# Patient Record
Sex: Female | Born: 1975 | Race: White | Hispanic: No | Marital: Single | Smoking: Never smoker
Health system: Southern US, Community
[De-identification: ages and names within clinical notes are randomized; demographics above are authoritative.]

---

## 2008-10-12 ENCOUNTER — Ambulatory Visit: Payer: Self-pay | Admitting: Obstetrics and Gynecology

## 2008-10-13 ENCOUNTER — Inpatient Hospital Stay: Payer: Self-pay | Admitting: Obstetrics and Gynecology

## 2010-04-13 ENCOUNTER — Ambulatory Visit (HOSPITAL_COMMUNITY): Admission: RE | Admit: 2010-04-13 | Discharge: 2010-04-13 | Payer: Self-pay | Admitting: Obstetrics and Gynecology

## 2014-08-11 ENCOUNTER — Ambulatory Visit: Payer: Self-pay | Admitting: Obstetrics and Gynecology

## 2016-07-25 ENCOUNTER — Other Ambulatory Visit: Payer: Self-pay | Admitting: Obstetrics and Gynecology

## 2016-07-25 DIAGNOSIS — Z1231 Encounter for screening mammogram for malignant neoplasm of breast: Secondary | ICD-10-CM

## 2016-10-14 ENCOUNTER — Other Ambulatory Visit: Payer: Self-pay | Admitting: Obstetrics and Gynecology

## 2016-10-14 DIAGNOSIS — Z1231 Encounter for screening mammogram for malignant neoplasm of breast: Secondary | ICD-10-CM

## 2017-01-14 ENCOUNTER — Ambulatory Visit
Admission: RE | Admit: 2017-01-14 | Discharge: 2017-01-14 | Disposition: A | Discharge status: Home / Self Care | Payer: BC Managed Care – PPO | Source: Ambulatory Visit | Attending: Obstetrics and Gynecology | Admitting: Obstetrics and Gynecology

## 2017-01-14 ENCOUNTER — Encounter: Payer: Self-pay | Admitting: Radiology

## 2017-01-14 DIAGNOSIS — Z1231 Encounter for screening mammogram for malignant neoplasm of breast: Secondary | ICD-10-CM | POA: Diagnosis present

## 2018-02-26 ENCOUNTER — Ambulatory Visit: Payer: Self-pay | Admitting: Physician Assistant

## 2018-02-26 ENCOUNTER — Other Ambulatory Visit: Payer: Self-pay

## 2018-02-26 ENCOUNTER — Encounter: Payer: Self-pay | Admitting: Physician Assistant

## 2018-02-26 ENCOUNTER — Ambulatory Visit (INDEPENDENT_AMBULATORY_CARE_PROVIDER_SITE_OTHER): Payer: BC Managed Care – PPO | Admitting: Physician Assistant

## 2018-02-26 VITALS — BP 110/72 | HR 78 | Temp 97.6°F | Resp 16 | Ht 58.25 in | Wt 117.0 lb

## 2018-02-26 DIAGNOSIS — Z0000 Encounter for general adult medical examination without abnormal findings: Secondary | ICD-10-CM | POA: Diagnosis not present

## 2018-02-26 DIAGNOSIS — R519 Headache, unspecified: Secondary | ICD-10-CM

## 2018-02-26 DIAGNOSIS — R51 Headache: Secondary | ICD-10-CM | POA: Diagnosis not present

## 2018-02-26 DIAGNOSIS — Z23 Encounter for immunization: Secondary | ICD-10-CM

## 2018-02-26 MED ORDER — PREDNISONE 20 MG PO TABS: ORAL_TABLET | ORAL | 0 refills | Status: DC

## 2018-02-26 NOTE — Progress Notes (Signed)
Patient ID: Melissa Crosby MRN: [id number], DOB: [birth year], 42 y.o. Date of Encounter: 02/26/2018,   Chief Complaint: New Patient, Establish Care, Physical (CPE)  HPI: 42 y.o. y/o female here for above.    She reports that she has lived in this area for 15 years.   States that she has always gone to her OB/GYN every year.   However has never had a PCP.   Decided that she probably should get established with a PCP "and get baseline ".  As far as seeing her gynecologist, she reports that she has had no abnormal GYN history. She has no known medical history at all.   The only surgery she has had is a C-section. Family History -- details are documented in that tab.  There is no premature family history of CAD or CA.  She works as a Chartered loss adjusterschoolteacher.  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  She works with the [employer]. [place] in [place].  She has a 44-year-old daughter and a 44 year old son.   Discussed that I have a daughter who is age 949 and a son who is age 44.  She states that the only issue that she needs to discuss today is that she has been having a nagging headache since around May.   On average she will feel it  5 to 7 days/week.   Says that when it started it was near the end of the school year since she thought it may just be related to stress etc.   However it would occur sometimes on weekends and has continued to occur this summer even though she has very minimal stress.   Points to her left forehead just above her left eye as the area where she feels the discomfort.   States that usually if it occurs it will come on in the morning.  It is not present when she first wakes up.  However she states that usually if she has not had a headache by around 11 AM that she usually does not develop a  headache that day. However does not happen every day.   States that she would rate the discomfort as 3/10.  Says that she really has not been using any medication for this recently.  States that there was about 3 weeks where it was more significant and she was taking 2 ibuprofen per day. States that it causes no photophobia.  No nausea. There is no family history of migraine. Asked if she is seen any pattern related to menses or eating chocolate or drinking wine.  She states that it does not seem to correlate with her menstrual cycle.  States that she does not drink wine and does not eat much chocolate.  Has seen no pattern or triggers.  That she does notice that sometimes it seems to increase if she is outside.  Is wondering if it is related to sinuses/allergies.  States that the area above her left eye at her left forehead is a little bit tender to the touch.  Reports that she has been having no mucus from the nose.  No rhinorrhea.  No other specific concerns to address today.     Review of Systems: Consitutional: No fever, chills, fatigue, night sweats, lymphadenopathy. No significant/unexplained weight changes. Eyes: No visual  changes, eye redness, or discharge. ENT/Mouth: No ear pain, sore throat, nasal drainage, or sinus pain. Cardiovascular: No chest pressure,heaviness, tightness or squeezing, even with exertion. No increased shortness of breath or dyspnea on exertion.No palpitations, edema, orthopnea, PND. Respiratory: No cough, hemoptysis, SOB, or wheezing. Gastrointestinal: No anorexia, dysphagia, reflux, pain, nausea, vomiting, hematemesis, diarrhea, constipation, BRBPR, or melena. Breast: No mass, nodules, bulging, or retraction. No skin changes or inflammation. No nipple discharge. No lymphadenopathy. Genitourinary: No dysuria, hematuria, incontinence, vaginal discharge, pruritis, burning, abnormal bleeding, or pain. Musculoskeletal: No decreased ROM, No joint pain or swelling. No  significant pain in neck, back, or extremities. Skin: No rash, pruritis, or concerning lesions. Neurological: No dizziness, syncope, seizures, tremors, memory loss, coordination problems, or paresthesias. Psychological: No anxiety, depression, hallucinations, SI/HI. Endocrine: No polydipsia, polyphagia, polyuria, or known diabetes.No increased fatigue. No palpitations/rapid heart rate. No significant/unexplained weight change. All other systems were reviewed and are otherwise negative.  History reviewed. No pertinent past medical history.   Past Surgical History:  Procedure Laterality Date  . CESAREAN SECTION      Home Meds:  Outpatient Medications Prior to Visit  Medication Sig Dispense Refill  . Multiple Vitamin (MULTIVITAMIN) tablet Take 1 tablet by mouth daily.     No facility-administered medications prior to visit.     Allergies: No Known Allergies  Social History   Socioeconomic History  . Marital status: Single    Spouse name: Not on file  . Number of children: Not on file  . Years of education: Not on file  . Highest education level: Not on file  Occupational History  . Not on file  Social Needs  . Financial resource strain: Not on file  . Food insecurity:    Worry: Not on file    Inability: Not on file  . Transportation needs:    Medical: Not on file    Non-medical: Not on file  Tobacco Use  . Smoking status: Never Smoker  . Smokeless tobacco: Never Used  Substance and Sexual Activity  . Alcohol use: Never    Frequency: Never  . Drug use: Not Currently  . Sexual activity: Not on file  Lifestyle  . Physical activity:    Days per week: Not on file    Minutes per session: Not on file  . Stress: Not on file  Relationships  . Social connections:    Talks on phone: Not on file    Gets together: Not on file    Attends religious service: Not on file    Active member of club or organization: Not on file    Attends meetings of clubs or organizations: Not  on file    Relationship status: Not on file  . Intimate partner violence:    Fear of current or ex partner: Not on file    Emotionally abused: Not on file    Physically abused: Not on file    Forced sexual activity: Not on file  Other Topics Concern  . Not on file  Social History Narrative  . Not on file    Family History  Problem Relation Age of Onset  . Hypertension Mother   . Hyperlipidemia Mother   . Alcohol abuse Father   . Early death Father   . Hypertension Sister   . Cancer Maternal Aunt   . Arthritis Maternal Grandmother   . Diabetes Maternal Grandmother   . Hyperlipidemia Maternal Grandmother   . Kidney disease Maternal Grandmother     Physical Exam:  Blood pressure 110/72, pulse 78, temperature 97.6 F (36.4 C), temperature source Oral, resp. rate 16, height 4' 10.25" (1.48 m), weight 53.1 kg (117 lb), SpO2 100 %., Body mass index is 24.24 kg/m. General: Well developed, well nourished WF. Appears in no acute distress. HEENT: Normocephalic, atraumatic. Conjunctiva pink, sclera non-icteric. Pupils 2 mm constricting to 1 mm, round, regular, and equally reactive to light and accomodation. EOMI. Internal auditory canal clear. TMs with good cone of light and without pathology. Nasal mucosa pink. Nares are without discharge. No sinus tenderness. Oral mucosa pink. She does have tenderness with percussion to left frontal sinus region.  No tenderness with percussion to right frontal sinus or bilateral maxillary sinus regions.  No tenderness with percussion of temporal artery area at temples bilaterally.  Neck: Supple. Trachea midline. No thyromegaly. Full ROM. No lymphadenopathy.No Carotid Bruits. Lungs: Clear to auscultation bilaterally without wheezes, rales, or rhonchi. Breathing is of normal effort and unlabored. Cardiovascular: RRR with S1 S2. No murmurs, rubs, or gallops. Distal pulses 2+ symmetrically. No carotid or abdominal bruits. Breast: Per Gyn Abdomen: Soft,  non-tender, non-distended with normoactive bowel sounds. No hepatosplenomegaly or masses. No rebound/guarding. No CVA tenderness. No hernias.  Genitourinary:  Per Gyn Musculoskeletal: Full range of motion and 5/5 strength throughout. Skin: Warm and moist without erythema, ecchymosis, wounds, or rash. Neuro: A+Ox3. CN II-XII grossly intact. Moves all extremities spontaneously. Full sensation throughout. Normal gait. Psych:  Responds to questions appropriately with a normal affect.   Assessment/Plan:  42 y.o. y/o female here for CPE   1. Encounter for medical examination to establish care  2. Encounter for preventive health examination  A. Screening Labs: She is not fasting today but reports that she will return fasting for labs tomorrow morning. - CBC with Differential/Platelet; Future - COMPLETE METABOLIC PANEL WITH GFR; Future - Lipid panel; Future - TSH; Future  B. Pap: --Per Gyn  C. Screening Mammogram: --Per Gyn Her mammograms are in epic.  Last was 01/14/2017.  Negative.  D. DEXA/BMD:  N/A at this age  E. Colorectal Cancer Screening: She has no indication to require this until age 74  F. Immunizations:  Influenza:-N/A Tetanus:--She is quite certain last tetanus was > 10 years ago.  Agreeable to update this today.  Tdap given here 02/26/2018. Pneumococcal: --She has no indication to require pneumonia vaccine until age 31 Shingirx: Not indicated until age 47  3. Sinus headache Her headache symptoms are most consistent with being sinus headache.  Will treat with prednisone taper to see if this will get rid of inflammation congestion pressure and pain and sinus.  If symptoms persist then follow-up for further evaluation. Headache is really not consistent with tension headache.  Location is not consistent with tension stress headache.  Fact that it comes on earlier in the morning is not consistent with stress/tension headache.  Headache has persisted even during summer while  she has been off work. Headache symptoms and findings on exam are not consistent with temporal arteritis. Possible migraine but not typical symptoms for migraine either. - predniSONE (DELTASONE) 20 MG tablet; Take 3 daily for 2 days, then 2 daily for 2 days, then 1 daily for 2 days.  Dispense: 12 tablet; Refill: 0   Signed, [address] [place], [place], [facility] 02/26/2018 9:56 AM

## 2018-02-26 NOTE — Addendum Note (Signed)
Addended by: Phineas SemenJOHNSON, TIFFANY A on: 02/26/2018 12:25 PM   Modules accepted: Orders

## 2018-02-26 NOTE — Progress Notes (Signed)
Patient was in office today and received her tdap vaccine in her left deltoid. Patient tolerated well

## 2018-02-27 ENCOUNTER — Other Ambulatory Visit: Payer: BC Managed Care – PPO

## 2018-02-27 DIAGNOSIS — Z0000 Encounter for general adult medical examination without abnormal findings: Secondary | ICD-10-CM

## 2018-02-27 LAB — CBC WITH DIFFERENTIAL/PLATELET
BASOS ABS: 51 {cells}/uL (ref 0–200)
BASOS PCT: 0.8 %
EOS ABS: 90 {cells}/uL (ref 15–500)
Eosinophils Relative: 1.4 %
HEMATOCRIT: 39.6 % (ref 35.0–45.0)
Hemoglobin: 13.1 g/dL (ref 11.7–15.5)
LYMPHS ABS: 1798 {cells}/uL (ref 850–3900)
MCH: 30.1 pg (ref 27.0–33.0)
MCHC: 33.1 g/dL (ref 32.0–36.0)
MCV: 91 fL (ref 80.0–100.0)
MONOS PCT: 7.2 %
MPV: 10 fL (ref 7.5–12.5)
NEUTROS ABS: 4000 {cells}/uL (ref 1500–7800)
Neutrophils Relative %: 62.5 %
Platelets: 193 10*3/uL (ref 140–400)
RBC: 4.35 10*6/uL (ref 3.80–5.10)
RDW: 11.7 % (ref 11.0–15.0)
Total Lymphocyte: 28.1 %
WBC: 6.4 10*3/uL (ref 3.8–10.8)
WBCMIX: 461 {cells}/uL (ref 200–950)

## 2018-02-27 LAB — COMPLETE METABOLIC PANEL WITH GFR
AG Ratio: 1.9 (calc) (ref 1.0–2.5)
ALT: 9 U/L (ref 6–29)
AST: 14 U/L (ref 10–30)
Albumin: 4.7 g/dL (ref 3.6–5.1)
Alkaline phosphatase (APISO): 67 U/L (ref 33–115)
BILIRUBIN TOTAL: 0.5 mg/dL (ref 0.2–1.2)
BUN: 14 mg/dL (ref 7–25)
CHLORIDE: 103 mmol/L (ref 98–110)
CO2: 29 mmol/L (ref 20–32)
CREATININE: 0.79 mg/dL (ref 0.50–1.10)
Calcium: 10 mg/dL (ref 8.6–10.2)
GFR, Est African American: 106 mL/min/{1.73_m2} (ref >=60)
GFR, Est Non African American: 91 mL/min/{1.73_m2} (ref >=60)
GLOBULIN: 2.5 g/dL (ref 1.9–3.7)
GLUCOSE: 84 mg/dL (ref 65–99)
Potassium: 5.3 mmol/L (ref 3.5–5.3)
SODIUM: 140 mmol/L (ref 135–146)
TOTAL PROTEIN: 7.2 g/dL (ref 6.1–8.1)

## 2018-02-27 LAB — LIPID PANEL
CHOL/HDL RATIO: 2.3 (calc) (ref <5.0)
Cholesterol: 180 mg/dL (ref <200)
HDL: 77 mg/dL (ref >50)
LDL CHOLESTEROL (CALC): 89 mg/dL
Non-HDL Cholesterol (Calc): 103 mg/dL (calc) (ref <130)
Triglycerides: 48 mg/dL (ref <150)

## 2018-02-27 LAB — TSH: TSH: 0.84 m[IU]/L

## 2018-09-29 ENCOUNTER — Other Ambulatory Visit: Payer: Self-pay | Admitting: Obstetrics and Gynecology

## 2018-09-29 DIAGNOSIS — Z1231 Encounter for screening mammogram for malignant neoplasm of breast: Secondary | ICD-10-CM

## 2019-01-19 ENCOUNTER — Other Ambulatory Visit: Payer: Self-pay

## 2019-01-19 ENCOUNTER — Ambulatory Visit
Admission: RE | Admit: 2019-01-19 | Discharge: 2019-01-19 | Disposition: A | Discharge status: Home / Self Care | Payer: BC Managed Care – PPO | Source: Ambulatory Visit | Attending: Obstetrics and Gynecology | Admitting: Obstetrics and Gynecology

## 2019-01-19 DIAGNOSIS — Z1231 Encounter for screening mammogram for malignant neoplasm of breast: Secondary | ICD-10-CM | POA: Diagnosis present

## 2019-10-03 ENCOUNTER — Ambulatory Visit: Payer: BC Managed Care – PPO | Attending: Internal Medicine

## 2019-10-03 DIAGNOSIS — Z23 Encounter for immunization: Secondary | ICD-10-CM | POA: Insufficient documentation

## 2019-10-03 NOTE — Progress Notes (Signed)
   Covid-19 Vaccination Clinic  Name:  Melissa Crosby    MRN: [id number] DOB: 1975-08-27  10/03/2019  Melissa Crosby was observed post Covid-19 immunization for 15 minutes without incident. She was provided with Vaccine Information Sheet and instruction to access the V-Safe system.   Melissa Crosby was instructed to call 911 with any severe reactions post vaccine: Marland Kitchen Difficulty breathing  . Swelling of face and throat  . A fast heartbeat  . A bad rash all over body  . Dizziness and weakness   Immunizations Administered    Name Date Dose VIS Date Route   Pfizer COVID-19 Vaccine 10/03/2019 12:52 PM 0.3 mL 07/09/2019 Intramuscular   Manufacturer: [employer], [employer]   Lot: [id number]   NDC: [address]-7

## 2019-10-24 ENCOUNTER — Ambulatory Visit: Payer: BC Managed Care – PPO | Attending: Internal Medicine

## 2019-10-24 DIAGNOSIS — Z23 Encounter for immunization: Secondary | ICD-10-CM

## 2019-10-24 NOTE — Progress Notes (Signed)
   Covid-19 Vaccination Clinic  Name:  Melissa Crosby    MRN: [id number] DOB: Oct 31, 1975  10/24/2019  [name] was observed post Covid-19 immunization for 15 minutes without incident. She was provided with Vaccine Information Sheet and instruction to access the V-Safe system.   [name] was instructed to call 911 with any severe reactions post vaccine: Marland Kitchen Difficulty breathing  . Swelling of face and throat  . A fast heartbeat  . A bad rash all over body  . Dizziness and weakness   Immunizations Administered    Name Date Dose VIS Date Route   Pfizer COVID-19 Vaccine 10/24/2019  2:02 PM 0.3 mL 07/09/2019 Intramuscular   Manufacturer: [employer], [employer]   Lot: [id number]   NDC: [address]-9

## 2020-02-23 ENCOUNTER — Other Ambulatory Visit: Payer: Self-pay | Admitting: Obstetrics and Gynecology

## 2020-02-23 DIAGNOSIS — Z1231 Encounter for screening mammogram for malignant neoplasm of breast: Secondary | ICD-10-CM

## 2020-03-17 ENCOUNTER — Ambulatory Visit
Admission: RE | Admit: 2020-03-17 | Discharge: 2020-03-17 | Disposition: A | Discharge status: Home / Self Care | Payer: BC Managed Care – PPO | Source: Ambulatory Visit | Attending: Obstetrics and Gynecology | Admitting: Obstetrics and Gynecology

## 2020-03-17 ENCOUNTER — Other Ambulatory Visit: Payer: Self-pay

## 2020-03-17 DIAGNOSIS — Z1231 Encounter for screening mammogram for malignant neoplasm of breast: Secondary | ICD-10-CM | POA: Insufficient documentation

## 2021-02-08 ENCOUNTER — Other Ambulatory Visit: Payer: Self-pay | Admitting: Obstetrics and Gynecology

## 2021-02-08 DIAGNOSIS — Z1231 Encounter for screening mammogram for malignant neoplasm of breast: Secondary | ICD-10-CM

## 2021-04-04 ENCOUNTER — Ambulatory Visit
Admission: RE | Admit: 2021-04-04 | Discharge: 2021-04-04 | Disposition: A | Discharge status: Home / Self Care | Payer: BC Managed Care – PPO | Source: Ambulatory Visit | Attending: Obstetrics and Gynecology | Admitting: Obstetrics and Gynecology

## 2021-04-04 ENCOUNTER — Other Ambulatory Visit: Payer: Self-pay

## 2021-04-04 DIAGNOSIS — Z1231 Encounter for screening mammogram for malignant neoplasm of breast: Secondary | ICD-10-CM | POA: Diagnosis present

## 2021-06-08 ENCOUNTER — Other Ambulatory Visit: Payer: Self-pay

## 2021-06-08 ENCOUNTER — Ambulatory Visit: Payer: BC Managed Care – PPO | Admitting: Nurse Practitioner

## 2021-06-08 ENCOUNTER — Encounter: Payer: Self-pay | Admitting: Nurse Practitioner

## 2021-06-08 VITALS — BP 128/78 | HR 60 | Temp 98.6°F | Resp 18 | Ht <= 58 in | Wt 117.0 lb

## 2021-06-08 DIAGNOSIS — Z13228 Encounter for screening for other metabolic disorders: Secondary | ICD-10-CM

## 2021-06-08 DIAGNOSIS — Z0000 Encounter for general adult medical examination without abnormal findings: Secondary | ICD-10-CM

## 2021-06-08 DIAGNOSIS — Z1159 Encounter for screening for other viral diseases: Secondary | ICD-10-CM

## 2021-06-08 DIAGNOSIS — Z13220 Encounter for screening for lipoid disorders: Secondary | ICD-10-CM | POA: Diagnosis not present

## 2021-06-08 DIAGNOSIS — Z7689 Persons encountering health services in other specified circumstances: Secondary | ICD-10-CM | POA: Diagnosis not present

## 2021-06-08 DIAGNOSIS — Z114 Encounter for screening for human immunodeficiency virus [HIV]: Secondary | ICD-10-CM

## 2021-06-08 DIAGNOSIS — Z136 Encounter for screening for cardiovascular disorders: Secondary | ICD-10-CM

## 2021-06-08 DIAGNOSIS — R233 Spontaneous ecchymoses: Secondary | ICD-10-CM

## 2021-06-08 DIAGNOSIS — Z130 Encounter for screening for diseases of the blood and blood-forming organs and certain disorders involving the immune mechanism: Secondary | ICD-10-CM

## 2021-06-08 NOTE — Progress Notes (Signed)
Subjective:    Patient ID: Melissa Crosby, female    DOB: 27-Sep-1975, 45 y.o.   MRN: [id number]  HPI: Melissa Crosby is a 45 y.o. female presenting for new patient visit to establish care.  Introduced to Publishing rights manager role and practice setting.  All questions answered.  Discussed provider/patient relationship and expectations.  Chief Complaint  Patient presents with   Establish Care   Bleeding/Bruising    Pt reports increased bruising in the past few months   She works at [employer] as a Engineer, site.   EASY BRUISING She donates blood every 6 weeks - has to eat a lot of hamburger/leafy greens the week before she is getting ready to donate; if she does not, she tells me her iron will be low.  Noticed recently, she has been bruising more frequently. Duration: 3 months Fatigue: no Decreased exercise tolerance: no  Dyspnea on exertion: no Palpitations: no Bleeding: no Pica: no  Tries to get in 30-40 oz of water per day.  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  No Known Allergies  Outpatient Encounter Medications as of 06/08/2021  Medication Sig   Multiple Vitamin (MULTIVITAMIN) tablet Take 1 tablet by mouth daily.   [DISCONTINUED] predniSONE (DELTASONE) 20 MG tablet Take 3 daily for 2 days, then 2 daily for 2 days, then 1 daily for 2 days.   No facility-administered encounter medications on file as of 06/08/2021.    Active Ambulatory Problems    Diagnosis Date Noted   No Active Ambulatory Problems   Resolved Ambulatory Problems    Diagnosis Date Noted   No Resolved Ambulatory Problems   No Additional Past Medical History    History reviewed. No pertinent past medical history.  Past Surgical History:  Procedure Laterality Date   CESAREAN SECTION      Social History   Tobacco Use   Smoking status: Never   Smokeless tobacco: Never  Substance Use  Topics   Alcohol use: Never   Drug use: Not Currently    Family History  Problem Relation Age of Onset   Hypertension Mother    Hyperlipidemia Mother    Alcohol abuse Father    Early death Father    Hypertension Sister    Cancer Maternal Aunt    Arthritis Maternal Grandmother    Diabetes Maternal Grandmother    Hyperlipidemia Maternal Grandmother    Kidney disease Maternal Grandmother    Breast cancer Neg Hx     Review of Systems Per HPI unless specifically indicated above     Objective:    BP 128/78   Pulse 60   Temp 98.6 F (37 C) (Oral)   Resp 18   Ht 4\' 10"  (1.473 m)   Wt 117 lb (53.1 kg)   LMP 12/22/2016   SpO2 99%   BMI 24.45 kg/m   Wt Readings from Last 3 Encounters:  06/08/21 117 lb (53.1 kg)  02/26/18 117 lb (53.1 kg)    Physical Exam Vitals and nursing note reviewed.  Constitutional:      General: She is not in acute distress.    Appearance: Normal appearance. She is not toxic-appearing.  HENT:     Head: Normocephalic and atraumatic.     Right Ear: Tympanic membrane, ear canal and external ear normal.     Left Ear: Tympanic membrane, ear canal and external ear normal.  Nose: Nose normal. No congestion.     Mouth/Throat:     Mouth: Mucous membranes are moist.     Pharynx: Oropharynx is clear. No oropharyngeal exudate or posterior oropharyngeal erythema.  Eyes:     General: No scleral icterus.    Extraocular Movements: Extraocular movements intact.     Pupils: Pupils are equal, round, and reactive to light.  Neck:     Vascular: No carotid bruit.  Cardiovascular:     Rate and Rhythm: Normal rate and regular rhythm.     Heart sounds: Normal heart sounds. No murmur heard. Pulmonary:     Effort: Pulmonary effort is normal. No respiratory distress.     Breath sounds: Normal breath sounds. No wheezing, rhonchi or rales.  Chest:     Comments: Breast examination deferred - follows with OB/GYN and up to date on mammogram Abdominal:     General:  Abdomen is flat. Bowel sounds are normal. There is no distension.     Palpations: Abdomen is soft.     Tenderness: There is no abdominal tenderness. There is no right CVA tenderness or left CVA tenderness.  Genitourinary:    Comments: Deferred - follows with OB/GYN Musculoskeletal:        General: Normal range of motion.     Cervical back: Normal range of motion and neck supple.     Right lower leg: No edema.     Left lower leg: No edema.  Lymphadenopathy:     Cervical: No cervical adenopathy.  Skin:    General: Skin is warm and dry.     Coloration: Skin is not jaundiced or pale.     Findings: No erythema.  Neurological:     Mental Status: She is alert and oriented to person, place, and time.     Motor: No weakness.     Gait: Gait normal.  Psychiatric:        Mood and Affect: Mood normal.        Behavior: Behavior normal.        Thought Content: Thought content normal.        Judgment: Judgment normal.      Assessment & Plan:   Problem List Items Addressed This Visit   None Visit Diagnoses     Annual physical exam    -  Primary   Relevant Orders   CBC with Differential/Platelet   COMPLETE METABOLIC PANEL WITH GFR   Lipid panel   HIV Antibody (routine testing w rflx)   Hepatitis C antibody   Encounter to establish care       Encounter for lipid screening for cardiovascular disease       Relevant Orders   Lipid panel   Encounter for screening for HIV       Relevant Orders   HIV Antibody (routine testing w rflx)   Need for hepatitis C screening test       Relevant Orders   Hepatitis C antibody   Screening for iron deficiency anemia       Relevant Orders   CBC with Differential/Platelet   Screening for metabolic disorder       Relevant Orders   COMPLETE METABOLIC PANEL WITH GFR   Easy bruising       Acute.  Suspect this is related to anemia from donating blood.  Will check CBC and suggested she move out blood donation to every 2 months.       Colon cancer  screen: reports she did Cologuard with  OB/GYN and it was "normal" - we will request records.   Mammogram/Pap smear: done with OB/GYN and in chart.  I recommended influenza shot, COVID booster.  She does not qualify for pneumonia vaccine.  She is up to date on Tdap.  She agrees to return for fasting blood work and we will check CBC, CMET, lipid panel, HIV and Hepatitis C screen.     Follow up plan: Return for pending lab work.

## 2021-07-16 ENCOUNTER — Other Ambulatory Visit: Payer: BC Managed Care – PPO

## 2021-07-16 ENCOUNTER — Other Ambulatory Visit: Payer: Self-pay

## 2021-07-16 DIAGNOSIS — Z1159 Encounter for screening for other viral diseases: Secondary | ICD-10-CM

## 2021-07-16 DIAGNOSIS — Z13228 Encounter for screening for other metabolic disorders: Secondary | ICD-10-CM

## 2021-07-16 DIAGNOSIS — Z114 Encounter for screening for human immunodeficiency virus [HIV]: Secondary | ICD-10-CM

## 2021-07-16 DIAGNOSIS — Z0000 Encounter for general adult medical examination without abnormal findings: Secondary | ICD-10-CM

## 2021-07-16 DIAGNOSIS — Z130 Encounter for screening for diseases of the blood and blood-forming organs and certain disorders involving the immune mechanism: Secondary | ICD-10-CM

## 2021-07-16 DIAGNOSIS — Z13220 Encounter for screening for lipoid disorders: Secondary | ICD-10-CM

## 2021-07-17 LAB — COMPLETE METABOLIC PANEL WITH GFR
AG Ratio: 1.6 (calc) (ref 1.0–2.5)
ALT: 9 U/L (ref 6–29)
AST: 11 U/L (ref 10–35)
Albumin: 4.1 g/dL (ref 3.6–5.1)
Alkaline phosphatase (APISO): 61 U/L (ref 31–125)
BUN: 11 mg/dL (ref 7–25)
CO2: 29 mmol/L (ref 20–32)
Calcium: 9.3 mg/dL (ref 8.6–10.2)
Chloride: 104 mmol/L (ref 98–110)
Creat: 0.84 mg/dL (ref 0.50–0.99)
Globulin: 2.6 g/dL (calc) (ref 1.9–3.7)
Glucose, Bld: 84 mg/dL (ref 65–99)
Potassium: 5.3 mmol/L (ref 3.5–5.3)
Sodium: 140 mmol/L (ref 135–146)
Total Bilirubin: 0.3 mg/dL (ref 0.2–1.2)
Total Protein: 6.7 g/dL (ref 6.1–8.1)
eGFR: 86 mL/min/{1.73_m2} (ref >=60)

## 2021-07-17 LAB — HIV ANTIBODY (ROUTINE TESTING W REFLEX): HIV 1&2 Ab, 4th Generation: NONREACTIVE

## 2021-07-17 LAB — CBC WITH DIFFERENTIAL/PLATELET
Absolute Monocytes: 521 cells/uL (ref 200–950)
Basophils Absolute: 59 cells/uL (ref 0–200)
Basophils Relative: 0.9 %
Eosinophils Absolute: 152 cells/uL (ref 15–500)
Eosinophils Relative: 2.3 %
HCT: 37.2 % (ref 35.0–45.0)
Hemoglobin: 12.2 g/dL (ref 11.7–15.5)
Lymphs Abs: 2105 cells/uL (ref 850–3900)
MCH: 30.1 pg (ref 27.0–33.0)
MCHC: 32.8 g/dL (ref 32.0–36.0)
MCV: 91.9 fL (ref 80.0–100.0)
MPV: 9.5 fL (ref 7.5–12.5)
Monocytes Relative: 7.9 %
Neutro Abs: 3762 cells/uL (ref 1500–7800)
Neutrophils Relative %: 57 %
Platelets: 281 10*3/uL (ref 140–400)
RBC: 4.05 10*6/uL (ref 3.80–5.10)
RDW: 11.5 % (ref 11.0–15.0)
Total Lymphocyte: 31.9 %
WBC: 6.6 10*3/uL (ref 3.8–10.8)

## 2021-07-17 LAB — HEPATITIS C ANTIBODY
Hepatitis C Ab: NONREACTIVE
SIGNAL TO CUT-OFF: 0.08 (ref <1.00)

## 2021-07-17 LAB — LIPID PANEL
Cholesterol: 173 mg/dL (ref <200)
HDL: 67 mg/dL (ref >=50)
LDL Cholesterol (Calc): 86 mg/dL (calc)
Non-HDL Cholesterol (Calc): 106 mg/dL (calc) (ref <130)
Total CHOL/HDL Ratio: 2.6 (calc) (ref <5.0)
Triglycerides: 104 mg/dL (ref <150)

## 2021-12-05 ENCOUNTER — Ambulatory Visit
Admission: EM | Admit: 2021-12-05 | Discharge: 2021-12-05 | Disposition: A | Discharge status: Home / Self Care | Payer: BC Managed Care – PPO | Attending: Family Medicine | Admitting: Family Medicine

## 2021-12-05 ENCOUNTER — Encounter: Payer: Self-pay | Admitting: Emergency Medicine

## 2021-12-05 ENCOUNTER — Ambulatory Visit: Payer: BC Managed Care – PPO

## 2021-12-05 DIAGNOSIS — H6593 Unspecified nonsuppurative otitis media, bilateral: Secondary | ICD-10-CM

## 2021-12-05 DIAGNOSIS — H66002 Acute suppurative otitis media without spontaneous rupture of ear drum, left ear: Secondary | ICD-10-CM | POA: Diagnosis not present

## 2021-12-05 MED ORDER — PREDNISONE 50 MG PO TABS: ORAL_TABLET | ORAL | 0 refills | Status: AC

## 2021-12-05 MED ORDER — AMOXICILLIN 875 MG PO TABS: 875.0000 mg | ORAL_TABLET | Freq: Two times a day (BID) | ORAL | 0 refills | Status: AC

## 2021-12-05 MED ORDER — FLUTICASONE PROPIONATE 50 MCG/ACT NA SUSP: 1.0000 | Freq: Two times a day (BID) | NASAL | 2 refills | Status: AC

## 2021-12-05 NOTE — ED Triage Notes (Signed)
Left ear feels clogged, started today.  States balance was off this morning.   ?

## 2021-12-05 NOTE — ED Provider Notes (Signed)
?[facility] ? ? ? ?CSN: [id number] ?Arrival date & time: 12/05/21  1645 ? ? ?  ? ?History   ?Chief Complaint ?No chief complaint on file. ? ? ?HPI ?Melissa Crosby is a 46 y.o. female.  ? ?Patient presenting today with a cough, postnasal drip for several days now and left ear muffled hearing, pressure, popping, fullness that started this morning.  Denies fever, chills, drainage, chest pain, shortness of breath.  Has not tried anything over-the-counter for symptoms.  No known pertinent chronic medical problems. ? ? ?History reviewed. No pertinent past medical history. ? ?There are no problems to display for this patient. ? ? ?Past Surgical History:  ?Procedure Laterality Date  ? CESAREAN SECTION    ? ? ?OB History   ?No obstetric history on file. ?  ? ? ? ?Home Medications   ? ?Prior to Admission medications   ?Medication Sig Start Date End Date Taking? Authorizing Provider  ?amoxicillin (AMOXIL) 875 MG tablet Take 1 tablet (875 mg total) by mouth 2 (two) times daily. 12/05/21  Yes Volney American, PA-C  ?fluticasone (FLONASE) 50 MCG/ACT nasal spray Place 1 spray into both nostrils 2 (two) times daily. 12/05/21  Yes Volney American, PA-C  ?predniSONE (DELTASONE) 50 MG tablet Take 1 tab in the morning with breakfast x 3 days 12/05/21  Yes Volney American, PA-C  ?Multiple Vitamin (MULTIVITAMIN) tablet Take 1 tablet by mouth daily.    [provider]  ? ? ?Family History ?Family History  ?Problem Relation Age of Onset  ? Hypertension Mother   ? Hyperlipidemia Mother   ? Alcohol abuse Father   ? Early death Father   ? Hypertension Sister   ? Cancer Maternal Aunt   ? Arthritis Maternal Grandmother   ? Diabetes Maternal Grandmother   ? Hyperlipidemia Maternal Grandmother   ? Kidney disease Maternal Grandmother   ? Breast cancer Neg Hx   ? ? ?Social History ?Social History  ? ?Tobacco Use  ? Smoking status: Never  ? Smokeless tobacco: Never  ?Substance Use Topics  ? Alcohol use: Never   ? Drug use: Not Currently  ? ? ? ?Allergies   ?Patient has no known allergies. ? ? ?Review of Systems ?Review of Systems ?Per HPI ? ?Physical Exam ?Triage Vital Signs ?ED Triage Vitals [12/05/21 1704]  ?Enc Vitals Group  ?   BP 118/78  ?   Pulse Rate 69  ?   Resp 18  ?   Temp 98.1 ?F (36.7 ?C)  ?   Temp Source Oral  ?   SpO2 100 %  ?   Weight   ?   Height   ?   Head Circumference   ?   Peak Flow   ?   Pain Score 0  ?   Pain Loc   ?   Pain Edu?   ?   Excl. in [place]?   ? ?No data found. ? ?Updated Vital Signs ?BP 118/78 (BP Location: Right Arm)   Pulse 69   Temp 98.1 ?F (36.7 ?C) (Oral)   Resp 18   LMP 12/22/2016   SpO2 100%  ? ?Visual Acuity ?Right Eye Distance:   ?Left Eye Distance:   ?Bilateral Distance:   ? ?Right Eye Near:   ?Left Eye Near:    ?Bilateral Near:    ? ?Physical Exam ?Vitals and nursing note reviewed.  ?Constitutional:   ?   Appearance: Normal appearance. She is not ill-appearing.  ?HENT:  ?  Head: Atraumatic.  ?   Ears:  ?   Comments: Significant bilateral middle ear effusion, left TM erythematous, edematous ?   Mouth/Throat:  ?   Mouth: Mucous membranes are moist.  ?   Pharynx: Oropharynx is clear.  ?Eyes:  ?   Extraocular Movements: Extraocular movements intact.  ?   Conjunctiva/sclera: Conjunctivae normal.  ?Cardiovascular:  ?   Rate and Rhythm: Normal rate and regular rhythm.  ?   Heart sounds: Normal heart sounds.  ?Pulmonary:  ?   Effort: Pulmonary effort is normal.  ?   Breath sounds: Normal breath sounds. No wheezing or rales.  ?Musculoskeletal:     ?   General: Normal range of motion.  ?   Cervical back: Normal range of motion and neck supple.  ?Skin: ?   General: Skin is warm and dry.  ?Neurological:  ?   Mental Status: She is alert and oriented to person, place, and time.  ?Psychiatric:     ?   Mood and Affect: Mood normal.     ?   Thought Content: Thought content normal.     ?   Judgment: Judgment normal.  ? ? ? ?UC Treatments / Results  ?Labs ?(all labs ordered are listed, but only  abnormal results are displayed) ?Labs Reviewed - No data to display ? ?EKG ? ? ?Radiology ?No results found. ? ?Procedures ?Procedures (including critical care time) ? ?Medications Ordered in UC ?Medications - No data to display ? ?Initial Impression / Assessment and Plan / UC Course  ?I have reviewed the triage vital signs and the nursing notes. ? ?Pertinent labs & imaging results that were available during my care of the patient were reviewed by me and considered in my medical decision making (see chart for details). ? ?  ? ?Treat with amoxicillin, prednisone, Flonase.  Discussed supportive over-the-counter medications and home care.  Return for acutely worsening symptoms. ? ?Final Clinical Impressions(s) / UC Diagnoses  ? ?Final diagnoses:  ?Acute suppurative otitis media of left ear without spontaneous rupture of tympanic membrane, recurrence not specified  ?Middle ear effusion, bilateral  ? ?Discharge Instructions   ?None ?  ? ?ED Prescriptions   ? ? Medication Sig Dispense Auth. Provider  ? amoxicillin (AMOXIL) 875 MG tablet Take 1 tablet (875 mg total) by mouth 2 (two) times daily. 20 tablet Volney American, [place]  ? predniSONE (DELTASONE) 50 MG tablet Take 1 tab in the morning with breakfast x 3 days 3 tablet Volney American, PA-C  ? fluticasone (FLONASE) 50 MCG/ACT nasal spray Place 1 spray into both nostrils 2 (two) times daily. 16 g Volney American, [place]  ? ?  ? ?PDMP not reviewed this encounter. ?  ?Volney American, PA-C ?12/05/21 1728 ? ?

## 2022-03-05 ENCOUNTER — Other Ambulatory Visit: Payer: Self-pay | Admitting: Obstetrics and Gynecology

## 2022-03-05 DIAGNOSIS — Z1231 Encounter for screening mammogram for malignant neoplasm of breast: Secondary | ICD-10-CM

## 2022-03-13 IMAGING — MG MM DIGITAL SCREENING BILAT W/ TOMO AND CAD
8 series · 8 of 24 positions shown · non-contrast
Comparison: Previous exam(s).

CLINICAL DATA: Screening.

EXAM:
DIGITAL SCREENING BILATERAL MAMMOGRAM WITH TOMOSYNTHESIS AND CAD
TECHNIQUE: Bilateral screening digital craniocaudal and mediolateral oblique
mammograms were obtained. Bilateral screening digital breast
tomosynthesis was performed. The images were evaluated with
computer-aided detection.

[R MLO synth-2D]
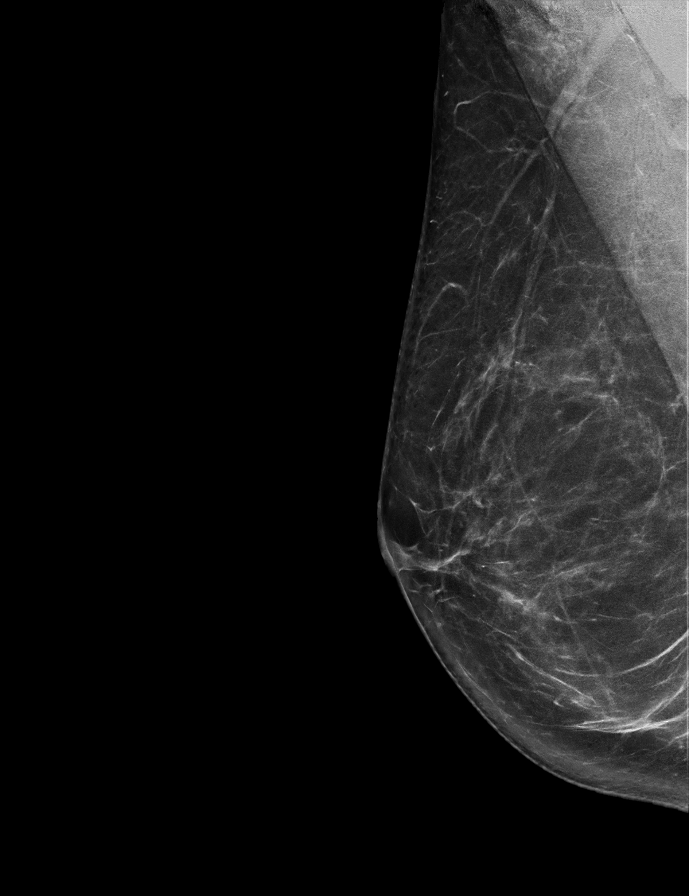

[L CC synth-2D]
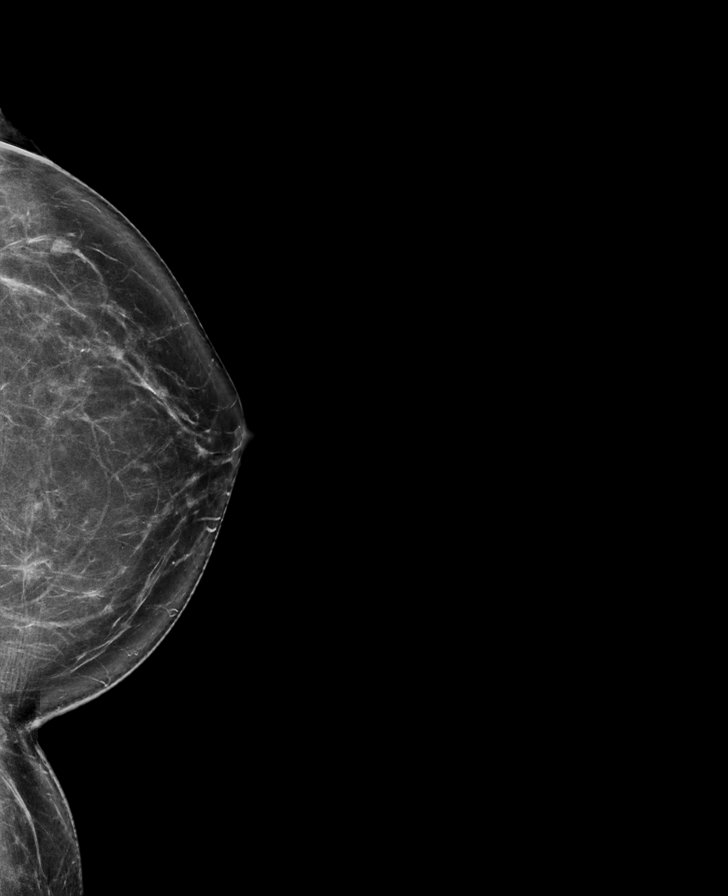

[R CC synth-2D]
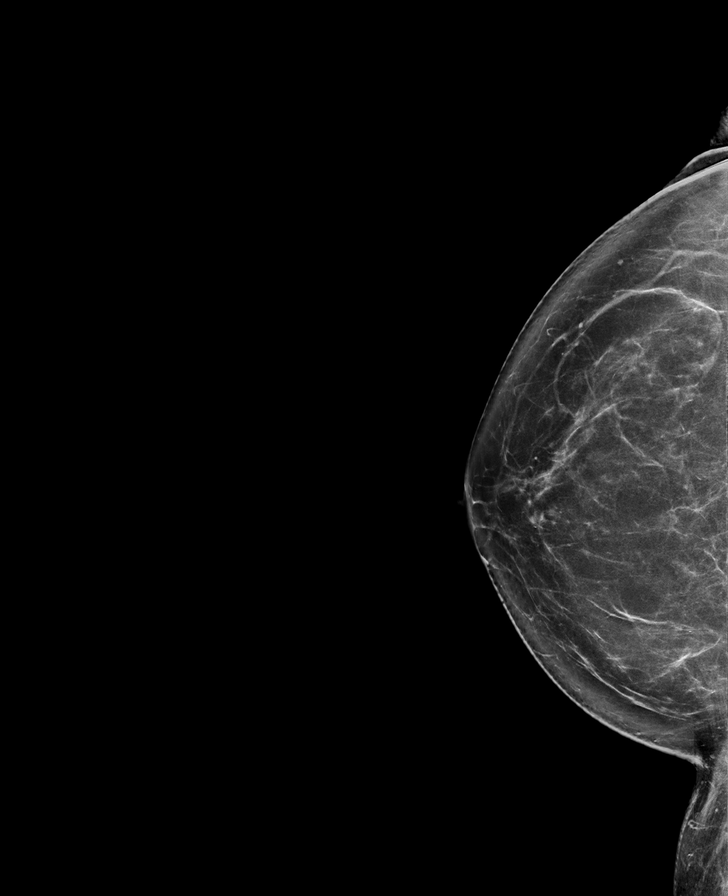

[L MLO synth-2D]
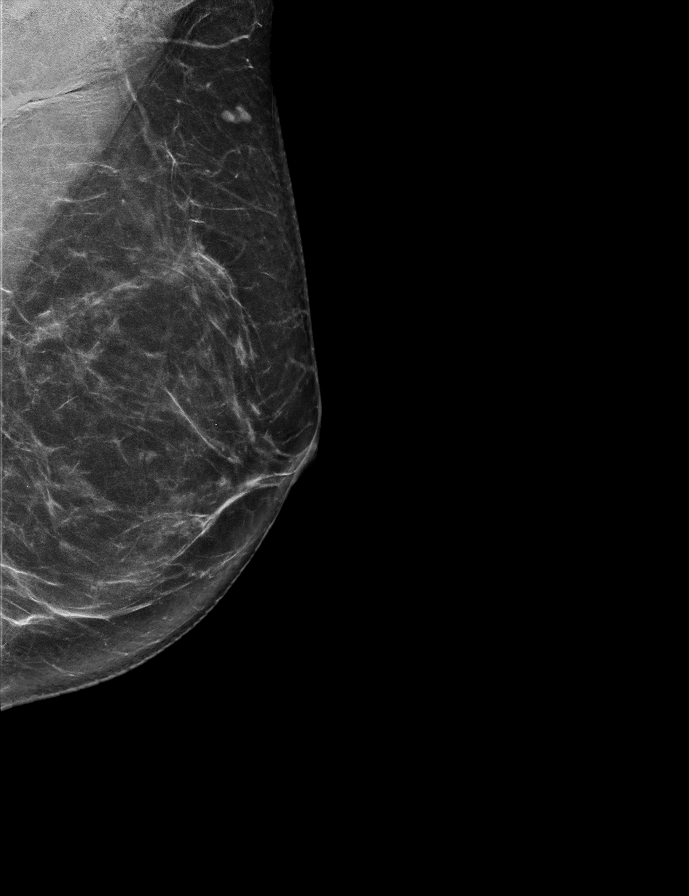

[R CC tomo · tomo slice 41/81.0]
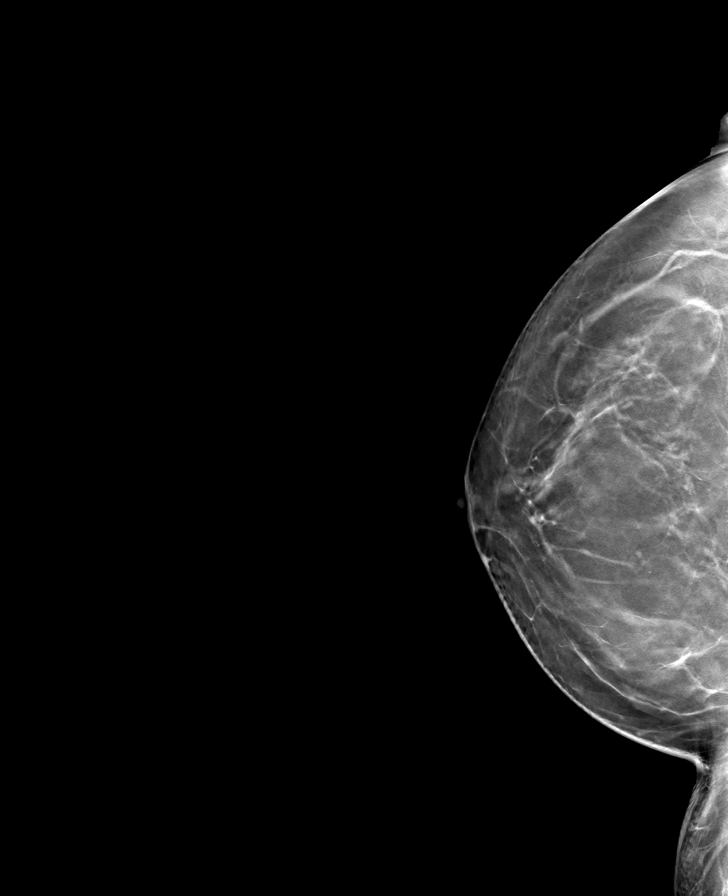

[R MLO tomo · tomo slice 39/77.0]
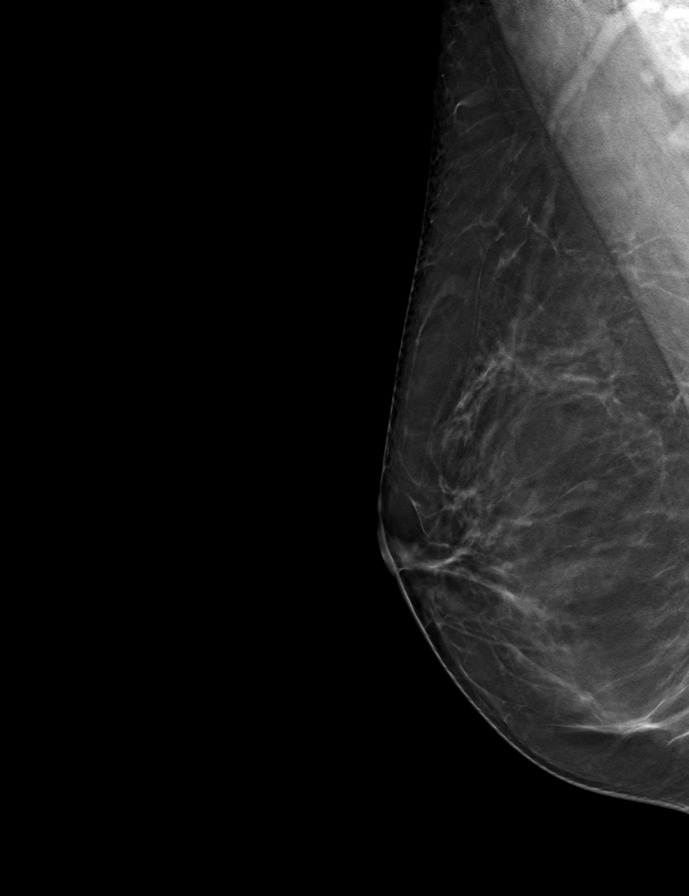

[L CC tomo · tomo slice 41/80.0]
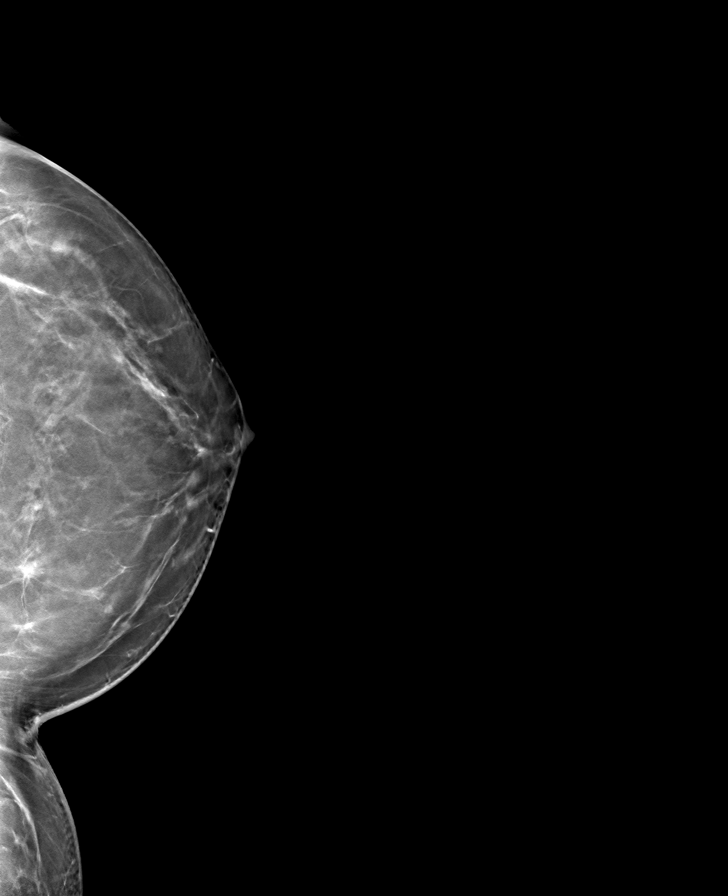

[L MLO tomo · tomo slice 37/73.0]
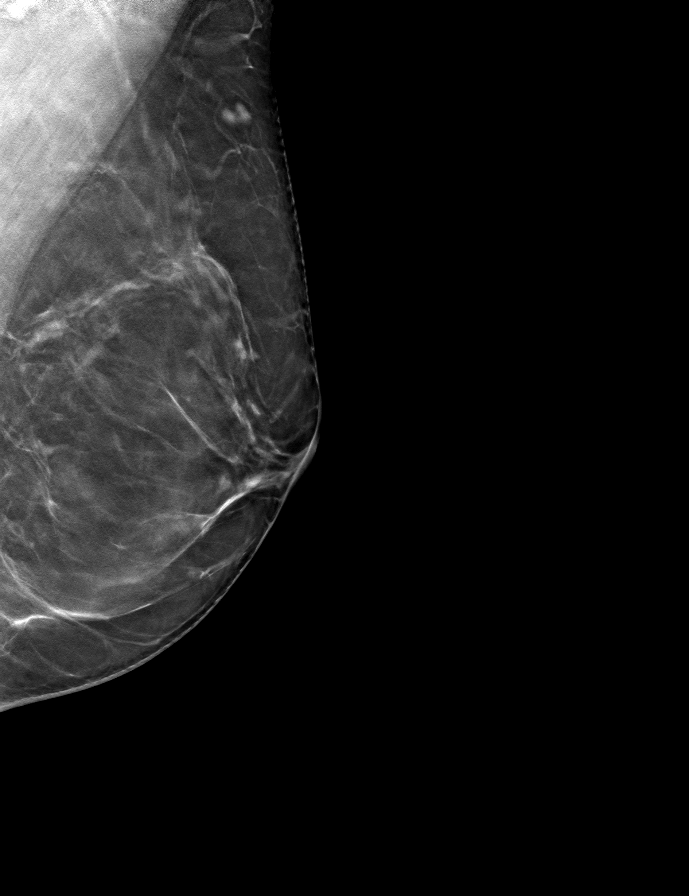

[8 of 24 positions shown; findings below may reference images not displayed]

ACR Breast Density Category b: There are scattered areas of
fibroglandular density.
FINDINGS: There are no findings suspicious for malignancy.
IMPRESSION: No mammographic evidence of malignancy. A result letter of this
screening mammogram will be mailed directly to the patient.

RECOMMENDATION:
Screening mammogram in one year. (Code:RP-F-LVX)

BI-RADS CATEGORY  1: Negative.

/Please Insert Correct Screening Template

## 2022-04-10 ENCOUNTER — Ambulatory Visit
Admission: RE | Admit: 2022-04-10 | Discharge: 2022-04-10 | Disposition: A | Discharge status: Home / Self Care | Payer: BC Managed Care – PPO | Source: Ambulatory Visit | Attending: Obstetrics and Gynecology | Admitting: Obstetrics and Gynecology

## 2022-04-10 DIAGNOSIS — Z1231 Encounter for screening mammogram for malignant neoplasm of breast: Secondary | ICD-10-CM | POA: Insufficient documentation

## 2022-06-14 ENCOUNTER — Encounter: Payer: BC Managed Care – PPO | Admitting: Nurse Practitioner

## 2023-09-16 ENCOUNTER — Ambulatory Visit: Payer: 59 | Admitting: Dermatology

## 2023-09-16 DIAGNOSIS — W908XXA Exposure to other nonionizing radiation, initial encounter: Secondary | ICD-10-CM

## 2023-09-16 DIAGNOSIS — L578 Other skin changes due to chronic exposure to nonionizing radiation: Secondary | ICD-10-CM

## 2023-09-16 DIAGNOSIS — D2371 Other benign neoplasm of skin of right lower limb, including hip: Secondary | ICD-10-CM | POA: Diagnosis not present

## 2023-09-16 DIAGNOSIS — D229 Melanocytic nevi, unspecified: Secondary | ICD-10-CM

## 2023-09-16 DIAGNOSIS — D2339 Other benign neoplasm of skin of other parts of face: Secondary | ICD-10-CM

## 2023-09-16 DIAGNOSIS — D239 Other benign neoplasm of skin, unspecified: Secondary | ICD-10-CM

## 2023-09-16 DIAGNOSIS — L821 Other seborrheic keratosis: Secondary | ICD-10-CM

## 2023-09-16 DIAGNOSIS — D225 Melanocytic nevi of trunk: Secondary | ICD-10-CM | POA: Diagnosis not present

## 2023-09-16 DIAGNOSIS — L814 Other melanin hyperpigmentation: Secondary | ICD-10-CM

## 2023-09-16 NOTE — Patient Instructions (Signed)

## 2023-09-16 NOTE — Progress Notes (Signed)
   New Patient Visit   Subjective  Melissa Crosby is a 48 y.o. female who presents for the following: a few spots that have changed and would like them to be checked. Patient advises one of the spots she had removed at right side 17 years ago, was benign and has repigmented.  Also a spot at right hairline and left leg, present for > 10 years.  Spot on knee might be slightly darker.  No personal or fhx skin cancer.  The patient has spots, moles and lesions to be evaluated, some may be new or changing and the patient may have concern these could be cancer.   The following portions of the chart were reviewed this encounter and updated as appropriate: medications, allergies, medical history  Review of Systems:  No other skin or systemic complaints except as noted in HPI or Assessment and Plan.  Objective  Well appearing patient in no apparent distress; mood and affect are within normal limits.    A focused examination was performed of the following areas: Face, leg, trunk  Relevant exam findings are noted in the Assessment and Plan.  right upper temporal hairline, left upper knee 3 mm dark gray blue macule at right upper temporal hairline 3 mm dark gray brown papule lighter edge at left upper knee Benign features under dermoscopy.    Assessment & Plan   MELANOCYTIC NEVI Exam: Tan-brown and/or pink-flesh-colored symmetric macules and papules back  Treatment Plan: Benign appearing on exam today. Recommend observation. Call clinic for new or changing moles. Recommend daily use of broad spectrum spf 30+ sunscreen to sun-exposed areas.   ACTINIC DAMAGE - chronic, secondary to cumulative UV radiation exposure/sun exposure over time - diffuse scaly erythematous macules with underlying dyspigmentation - Recommend daily broad spectrum sunscreen SPF 30+ to sun-exposed areas, reapply every 2 hours as needed.  - Recommend staying in the shade or wearing long sleeves, sun glasses (UVA+UVB  protection) and wide brim hats (4-inch brim around the entire circumference of the hat). - Call for new or changing lesions.  SEBORRHEIC KERATOSIS - Stuck-on, waxy, tan-brown papules at right flank at edge of scar - Benign-appearing - Discussed benign etiology and prognosis.  Discussed cryotherapy if spot(s) become irritated or inflamed.  - Observe - Call for any changes  LENTIGINES Exam: scattered tan macules Due to sun exposure Treatment Plan: Benign-appearing, observe. Recommend daily broad spectrum sunscreen SPF 30+ to sun-exposed areas, reapply every 2 hours as needed.  Call for any changes  BLUE NEVUS right upper temporal hairline, left upper knee Benign-appearing.  Observation.  Call clinic for new or changing moles.  Recommend daily use of broad spectrum spf 30+ sunscreen to sun-exposed areas.    ABCDEs of mole observation discussed.  RTC if any changes noted.  Discussed photoprotection and regular use of broad-spectrum spf 30+ sunscreen.  Staying in the shade or wearing long sleeves, sun glasses (UVA+UVB protection) and wide brim hats (4-inch brim around the entire circumference of the hat) are also recommended for sun protection.   NEVUS   ACTINIC SKIN DAMAGE   SEBORRHEIC KERATOSIS   LENTIGO    Return in about 1 year (around 09/15/2024) for recheck nevus, with Dr. Roseanne Reno.  Anise Salvo, RMA, am acting as scribe for Willeen Niece, MD .   Documentation: I have reviewed the above documentation for accuracy and completeness, and I agree with the above.  Willeen Niece, MD

## 2023-12-04 ENCOUNTER — Other Ambulatory Visit: Payer: Self-pay | Admitting: Internal Medicine

## 2023-12-04 DIAGNOSIS — Z1231 Encounter for screening mammogram for malignant neoplasm of breast: Secondary | ICD-10-CM

## 2023-12-17 LAB — COLOGUARD: COLOGUARD: NEGATIVE

## 2024-03-03 ENCOUNTER — Ambulatory Visit
Admission: RE | Admit: 2024-03-03 | Discharge: 2024-03-03 | Disposition: A | Discharge status: Home / Self Care | Source: Ambulatory Visit | Attending: Internal Medicine | Admitting: Internal Medicine

## 2024-03-03 DIAGNOSIS — Z1231 Encounter for screening mammogram for malignant neoplasm of breast: Secondary | ICD-10-CM | POA: Insufficient documentation

## 2024-09-20 ENCOUNTER — Ambulatory Visit: Payer: 59 | Admitting: Dermatology
